# Patient Record
Sex: Female | Born: 1995 | Race: Black or African American | Hispanic: No | Marital: Single | Smoking: Never smoker
Health system: Southern US, Community
[De-identification: ages and names within clinical notes are randomized; demographics above are authoritative.]

---
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
## 2019-12-30 ENCOUNTER — Emergency Department (HOSPITAL_COMMUNITY)
Admission: EM | Admit: 2019-12-30 | Discharge: 2019-12-31 | Disposition: A | Discharge status: Home / Self Care | Payer: Medicaid Other | Attending: Emergency Medicine | Admitting: Emergency Medicine

## 2019-12-30 ENCOUNTER — Other Ambulatory Visit: Payer: Self-pay

## 2019-12-30 ENCOUNTER — Encounter (HOSPITAL_COMMUNITY): Payer: Self-pay | Admitting: Emergency Medicine

## 2019-12-30 DIAGNOSIS — X102XXA Contact with fats and cooking oils, initial encounter: Secondary | ICD-10-CM | POA: Insufficient documentation

## 2019-12-30 DIAGNOSIS — Y939 Activity, unspecified: Secondary | ICD-10-CM | POA: Diagnosis not present

## 2019-12-30 DIAGNOSIS — T300 Burn of unspecified body region, unspecified degree: Secondary | ICD-10-CM

## 2019-12-30 DIAGNOSIS — T23201A Burn of second degree of right hand, unspecified site, initial encounter: Secondary | ICD-10-CM | POA: Insufficient documentation

## 2019-12-30 DIAGNOSIS — Y999 Unspecified external cause status: Secondary | ICD-10-CM | POA: Diagnosis not present

## 2019-12-30 DIAGNOSIS — Y929 Unspecified place or not applicable: Secondary | ICD-10-CM | POA: Diagnosis not present

## 2019-12-30 DIAGNOSIS — T23261A Burn of second degree of back of right hand, initial encounter: Secondary | ICD-10-CM

## 2019-12-30 MED ORDER — BACITRACIN ZINC 500 UNIT/GM EX OINT
TOPICAL_OINTMENT | Freq: Two times a day (BID) | CUTANEOUS | Status: DC
  Filled 2019-12-30: qty 0.9

## 2019-12-30 MED ORDER — SILVER SULFADIAZINE 1 % EX CREA: 1.0000 "application " | TOPICAL_CREAM | Freq: Every day | CUTANEOUS | 0 refills | Status: DC

## 2019-12-30 MED ORDER — SILVER SULFADIAZINE 1 % EX CREA
TOPICAL_CREAM | Freq: Two times a day (BID) | CUTANEOUS | Status: DC
  Administered 2019-12-30: 1 via TOPICAL
  Filled 2019-12-30: qty 50

## 2019-12-30 MED ORDER — SILVER SULFADIAZINE 1 % EX CREA: 1.0000 "application " | TOPICAL_CREAM | Freq: Two times a day (BID) | CUTANEOUS | 0 refills | Status: DC
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
## 2019-12-30 NOTE — Discharge Instructions (Signed)
Keep the wound clean and dry. Wash the wounds with soap and water twice daily. Apply silvadene ointment to the wound and then put on the dry, sterile dressing.  Return to the ER if there is any fevers, chill, new swelling or redness.

## 2020-05-13 ENCOUNTER — Other Ambulatory Visit: Payer: Self-pay

## 2020-05-13 ENCOUNTER — Emergency Department (HOSPITAL_COMMUNITY)
Admission: EM | Admit: 2020-05-13 | Discharge: 2020-05-13 | Disposition: A | Discharge status: Home / Self Care | Payer: Medicaid Other | Attending: Emergency Medicine | Admitting: Emergency Medicine

## 2020-05-13 ENCOUNTER — Encounter (HOSPITAL_COMMUNITY): Payer: Self-pay

## 2020-05-13 ENCOUNTER — Emergency Department (HOSPITAL_COMMUNITY): Payer: Medicaid Other

## 2020-05-13 DIAGNOSIS — R059 Cough, unspecified: Secondary | ICD-10-CM | POA: Diagnosis present

## 2020-05-13 DIAGNOSIS — Z20822 Contact with and (suspected) exposure to covid-19: Secondary | ICD-10-CM | POA: Insufficient documentation

## 2020-05-13 DIAGNOSIS — J069 Acute upper respiratory infection, unspecified: Secondary | ICD-10-CM | POA: Diagnosis not present

## 2020-05-13 DIAGNOSIS — R0602 Shortness of breath: Secondary | ICD-10-CM | POA: Diagnosis not present

## 2020-05-13 LAB — SARS CORONAVIRUS 2 (TAT 6-24 HRS): SARS Coronavirus 2: NEGATIVE

## 2020-05-13 MED ORDER — ALBUTEROL SULFATE HFA 108 (90 BASE) MCG/ACT IN AERS: 2.0000 | INHALATION_SPRAY | Freq: Four times a day (QID) | RESPIRATORY_TRACT | 2 refills | Status: DC | PRN

## 2020-05-13 NOTE — ED Triage Notes (Signed)
Pt reports intermittent shob and mild, aggravating nonproductive cough since Tuesday.

## 2020-05-13 NOTE — ED Provider Notes (Signed)
[facility]-EMERGENCY DEPT Provider Note   CSN: [id number] Arrival date & time: 05/13/20  9735     History Chief Complaint  Patient presents with  . Cough    Kim Molina is a 25 y.o. female.  The history is provided by the patient. No language interpreter was used.  Cough Cough characteristics:  Productive Sputum characteristics:  Nondescript Severity:  Moderate Onset quality:  Gradual Duration:  1 week Timing:  Constant Relieved by:  Nothing Worsened by:  Nothing Ineffective treatments:  None tried Associated symptoms: shortness of breath   Pt reports she had covid in 2020.  Pt thinks she may have covid again    History reviewed. No pertinent past medical history.  There are no problems to display for this patient.   Past Surgical History:  Procedure Laterality Date  . APPENDECTOMY  2017     OB History   No obstetric history on file.     No family history on file.  Social History   Tobacco Use  . Smoking status: Never Smoker  . Smokeless tobacco: Never Used  Substance Use Topics  . Drug use: Yes    Types: Marijuana    Home Medications Prior to Admission medications   Medication Sig Start Date End Date Taking? Authorizing Provider  albuterol (VENTOLIN HFA) 108 (90 Base) MCG/ACT inhaler Inhale 2 puffs into the lungs every 6 (six) hours as needed for wheezing or shortness of breath. 05/13/20  Yes Cheron Schaumann K, PA-C  silver sulfADIAZINE (SILVADENE) 1 % cream Apply 1 application topically 2 (two) times daily. 12/30/19   Derwood Kaplan, MD    Allergies    Penicillins  Review of Systems   Review of Systems  Respiratory: Positive for cough and shortness of breath.   All other systems reviewed and are negative.   Physical Exam Updated Vital Signs BP (!) 142/95 (BP Location: Right Arm)   Pulse 75   Temp 98.6 F (37 C) (Oral)   Resp 18   Ht 5' (1.524 m)   Wt 93.9 kg   SpO2 93%   BMI 40.43 kg/m   Physical Exam Vitals  and nursing note reviewed.  Constitutional:      Appearance: She is well-developed and well-nourished.  HENT:     Head: Normocephalic.  Eyes:     Extraocular Movements: EOM normal.  Cardiovascular:     Rate and Rhythm: Normal rate.  Pulmonary:     Effort: Pulmonary effort is normal.     Breath sounds: No wheezing.  Abdominal:     General: There is no distension.  Musculoskeletal:        General: Normal range of motion.     Cervical back: Normal range of motion.  Neurological:     Mental Status: She is alert and oriented to person, place, and time.  Psychiatric:        Mood and Affect: Mood and affect normal.     ED Results / Procedures / Treatments   Labs (all labs ordered are listed, but only abnormal results are displayed) Labs Reviewed  SARS CORONAVIRUS 2 (TAT 6-24 HRS)    EKG None  Radiology DG Chest 2 View  Result Date: 05/13/2020 CLINICAL DATA:  Fever, cough and shortness of breath EXAM: CHEST - 2 VIEW COMPARISON:  None. FINDINGS: The heart size and mediastinal contours are within normal limits. There is no evidence of pulmonary edema, consolidation, pneumothorax, nodule or pleural fluid. The visualized skeletal structures are unremarkable. IMPRESSION: No  active cardiopulmonary disease. Electronically Signed   By: Irish Lack M.D.   On: 05/13/2020 12:16    Procedures Procedures (including critical care time)  Medications Ordered in ED Medications - No data to display  ED Course  I have reviewed the triage vital signs and the nursing notes.  Pertinent labs & imaging results that were available during my care of the patient were reviewed by me and considered in my medical decision making (see chart for details).    MDM Rules/Calculators/A&P                          Covid pending. Chest xray no acute abnormaliaty  Final Clinical Impression(s) / ED Diagnoses Final diagnoses:  Viral URI with cough    Rx / DC Orders ED Discharge Orders          Ordered    albuterol (VENTOLIN HFA) 108 (90 Base) MCG/ACT inhaler  Every 6 hours PRN        05/13/20 1435        An After Visit Summary was printed and given to the patient.    Osie Cheeks 05/13/20 1522    Lorre Nick, MD 05/15/20 1320

## 2020-05-13 NOTE — Discharge Instructions (Addendum)
Your covid test is pending 

## 2020-06-10 MED ORDER — IOHEXOL 9 MG/ML PO SOLN
ORAL | Status: AC
  Filled 2020-06-10: qty 500

## 2020-09-28 ENCOUNTER — Encounter (HOSPITAL_COMMUNITY): Payer: Self-pay | Admitting: Emergency Medicine

## 2020-09-28 ENCOUNTER — Emergency Department (HOSPITAL_COMMUNITY): Payer: Medicaid Other

## 2020-09-28 ENCOUNTER — Other Ambulatory Visit: Payer: Self-pay

## 2020-09-28 ENCOUNTER — Emergency Department (HOSPITAL_COMMUNITY)
Admission: EM | Admit: 2020-09-28 | Discharge: 2020-09-28 | Disposition: A | Discharge status: Home / Self Care | Payer: Medicaid Other | Attending: Emergency Medicine | Admitting: Emergency Medicine

## 2020-09-28 DIAGNOSIS — M25571 Pain in right ankle and joints of right foot: Secondary | ICD-10-CM | POA: Diagnosis present

## 2020-09-28 MED ORDER — ACETAMINOPHEN 500 MG PO TABS
1000.0000 mg | ORAL_TABLET | Freq: Once | ORAL | Status: AC
  Administered 2020-09-28: 1000 mg via ORAL
  Filled 2020-09-28: qty 2

## 2020-09-28 MED ORDER — IBUPROFEN 800 MG PO TABS
800.0000 mg | ORAL_TABLET | Freq: Once | ORAL | Status: AC
  Administered 2020-09-28: 800 mg via ORAL
  Filled 2020-09-28: qty 1

## 2020-09-28 NOTE — ED Provider Notes (Signed)
[facility]   CSN: [id number] Arrival date & time: 09/28/20  5465     History Chief Complaint  Patient presents with  . Leg Pain    Kim Molina is a 25 y.o. female who presents with severe right ankle pain that woke her from sleep at 3:00 this morning.  Patient states she has had soreness in the ankle for 2 days, however the pain woke her from her sleep and the swelling that she is experiencing this morning is new.  She states she is unable to flex the joint, however it is unclear if it is secondary to pain or because she physically is unable to range it.  Denies any swelling in her leg but does endorse that the pain radiates up into her calf.  Pain is elicited when she bears weight on the foot.  Denies any history of trauma or injury to the foot.  I personally reviewed this patient's medical records.  She is an otherwise healthy 25 year old female with no new medications every day. Denies any history of gout or IV drug use.   HPI     History reviewed. No pertinent past medical history.  There are no problems to display for this patient.   Past Surgical History:  Procedure Laterality Date  . APPENDECTOMY  2017     OB History   No obstetric history on file.     No family history on file.  Social History   Tobacco Use  . Smoking status: Never Smoker  . Smokeless tobacco: Never Used  Substance Use Topics  . Drug use: Yes    Types: Marijuana    Home Medications Prior to Admission medications   Medication Sig Start Date End Date Taking? Authorizing Provider  albuterol (VENTOLIN HFA) 108 (90 Base) MCG/ACT inhaler Inhale 2 puffs into the lungs every 6 (six) hours as needed for wheezing or shortness of breath. Patient not taking: No sig reported 05/13/20   Elson Areas, PA-C  silver sulfADIAZINE (SILVADENE) 1 % cream Apply 1 application topically 2 (two) times daily. Patient not taking: No sig reported 12/30/19    Derwood Kaplan, MD    Allergies    Penicillins  Review of Systems   Review of Systems  Constitutional: Negative.   HENT: Negative.   Respiratory: Negative.   Cardiovascular: Negative.   Gastrointestinal: Negative.   Musculoskeletal: Positive for arthralgias and joint swelling.  Skin: Negative.   Allergic/Immunologic: Negative.   Hematological: Negative.     Physical Exam Updated Vital Signs BP (!) 128/91   Pulse 76   Temp 98.2 F (36.8 C) (Oral)   Resp 18   LMP 09/06/2020   SpO2 99%   Physical Exam Vitals and nursing note reviewed.  Constitutional:      Appearance: She is obese. She is not ill-appearing or toxic-appearing.  HENT:     Head: Normocephalic and atraumatic.     Nose: Nose normal.     Mouth/Throat:     Mouth: Mucous membranes are moist.     Pharynx: Oropharynx is clear. Uvula midline. No oropharyngeal exudate, posterior oropharyngeal erythema or uvula swelling.     Tonsils: No tonsillar exudate.  Eyes:     General: Lids are normal. Vision grossly intact.        Right eye: No discharge.        Left eye: No discharge.     Extraocular Movements: Extraocular movements intact.     Conjunctiva/sclera: Conjunctivae normal.  Pupils: Pupils are equal, round, and reactive to light.  Cardiovascular:     Rate and Rhythm: Normal rate and regular rhythm.     Pulses: Normal pulses.     Heart sounds: Normal heart sounds. No murmur heard.   Pulmonary:     Effort: Pulmonary effort is normal. No tachypnea, bradypnea or respiratory distress.     Breath sounds: Normal breath sounds. No wheezing or rales.  Chest:     Chest wall: No mass, lacerations, deformity, swelling, tenderness or edema.  Abdominal:     General: Bowel sounds are normal. There is no distension.     Palpations: Abdomen is soft.     Tenderness: There is no abdominal tenderness. There is no right CVA tenderness or left CVA tenderness.  Musculoskeletal:        General: No deformity.      Cervical back: Normal range of motion and neck supple. No edema, rigidity or crepitus. No pain with movement.     Right knee: Normal.     Left knee: Normal.     Right lower leg: Normal. No edema.     Left lower leg: Normal. No edema.     Right ankle: Swelling present. No deformity. Tenderness present over the ATF ligament and AITF ligament. No lateral malleolus tenderness. Decreased range of motion. Anterior drawer test negative. Normal pulse.     Right Achilles Tendon: Normal.     Left ankle: Normal.     Left Achilles Tendon: Normal.     Right foot: Normal capillary refill. Swelling present. No crepitus. Normal pulse.     Left foot: Normal.       Legs:  Skin:    General: Skin is warm and dry.     Capillary Refill: Capillary refill takes less than 2 seconds.  Neurological:     Mental Status: She is alert. Mental status is at baseline.     Sensory: Sensation is intact.  Psychiatric:        Mood and Affect: Mood normal.     ED Results / Procedures / Treatments   Labs (all labs ordered are listed, but only abnormal results are displayed) Labs Reviewed - No data to display  EKG None  Radiology DG Ankle Complete Right  Result Date: 09/28/2020 CLINICAL DATA:  Pain EXAM: RIGHT ANKLE - COMPLETE 3+ VIEW COMPARISON:  None. FINDINGS: Frontal, oblique, and lateral views were obtained. No acute fracture or joint effusion. Calcifications inferior to the lateral malleolus may represent residua of prior trauma. There is no appreciable joint space narrowing or erosion. Ankle mortise appears intact. IMPRESSION: Calcifications inferior to the lateral malleolus likely represent residua of prior trauma. No acute fracture evident. No arthropathy. Ankle mortise appears intact. Electronically Signed   By: Bretta Bang III M.D.   On: 09/28/2020 10:28   DG Foot Complete Right  Result Date: 09/28/2020 CLINICAL DATA:  Pain and swelling EXAM: RIGHT FOOT COMPLETE - 3+ VIEW COMPARISON:  None. FINDINGS:  Frontal, oblique, and lateral views were obtained. No fracture or dislocation. No appreciable joint space narrowing. There is a degree of hallux valgus deformity at the first MTP joint with mild bunion formation medially. There is an os naviculare, an anatomic variant. IMPRESSION: There is a degree of hallux valgus deformity at the first MTP joint with mild bunion formation medially. No appreciable joint space narrowing or erosion. No fracture or dislocation. Electronically Signed   By: Bretta Bang III M.D.   On: 09/28/2020 10:30  Procedures Procedures  Medications Ordered in ED Medications  ibuprofen (ADVIL) tablet 800 mg (800 mg Oral Given 09/28/20 1014)  acetaminophen (TYLENOL) tablet 1,000 mg (1,000 mg Oral Given 09/28/20 1227)    ED Course  I have reviewed the triage vital signs and the nursing notes.  Pertinent labs & imaging results that were available during my care of the patient were reviewed by me and considered in my medical decision making (see chart for details).    MDM Rules/Calculators/A&P                         25 year old female who presents for severe ankle pain that woke her from her sleep this morning.  No history of trauma to the joint, no history of gout.    Differential diagnosis includes but is not limited to acute fracture dislocation, ankle sprain/ ligamentous injury, gout, pseudogout, septic arthritis, osteoarthritis, cellulitis.  Vital signs are normal on intake.  Cardiopulmonary exam is normal abdominal exam is benign.  Patient has normal 2+ DP pulses bilaterally, full sensation and normal capillary refill in all 10 toes.  Patient with symmetric strength in plantar flexion.  Patient unwilling to attempt dorsiflexion in the right ankle given pain in the joint.  There is no erythema, warmth to touch, or significant edema of the right anterior ankle to suggest gout or pseudogout, or infectious etiology.  No acute fracture or dislocation on plain films, though  there is changes suggestive of remote trauma to the right ankle.  While the exact etiology of this patient symptoms remains unclear, there is no evidence of infectious etiology, or neurovascular compromise.  Will place patient in Ace wrap and provide crutches and discharged with plan for scheduled NSAID therapy and follow-up with orthopedics.  No further work-up warranted in the ED at this time.  Darrin voiced understanding of her medical evaluation and treatment plan.  Each of her questions was answered to her expressed satisfaction.  Return precautions were given.  Patient is stable and appropriate for discharge at this time.  This chart was dictated using voice recognition software, Dragon. Despite the best efforts of this provider to proofread and correct errors, errors may still occur which can change documentation meaning.  Final Clinical Impression(s) / ED Diagnoses Final diagnoses:  Acute right ankle pain    Rx / DC Orders ED Discharge Orders    None       Sherrilee Gilles 09/28/20 1721    Gerhard Munch, MD 09/29/20 2339

## 2020-09-28 NOTE — Discharge Instructions (Addendum)
You were seen in the ER today for your right ankle pain.  Your physical exam and x-rays are reassuring.  You have normal blood flow and sensation in your feet, and you are able to move the toes normally.  There is no broken bones or dislocations on your x-ray.  With the exact cause of your pain remains unclear, there does not appear to be any emergent issue with your ankle at this time.  To help with your pain you been placed on Ace wrap and given crutches.  You may take ibuprofen and Tylenol around-the-clock to help with your symptoms.  You may alternate ibuprofen 600 mg and Tylenol 1000 mg every 3 hours.  Below is contact information for Dr. August Saucer, the orthopedic doctor with whom you should follow-up.  Please call his office first thing tomorrow morning to schedule follow-up appointment.  You have been placed in a splint and crutches for your comfort.  Return to the ER if you develop any new numbness, tingling, weakness in the foot, or any other new severe symptoms.

## 2020-09-28 NOTE — ED Triage Notes (Signed)
Patient c/o right leg pain starting at ankle and progressing up leg x2 days. Denies trauma or injury. Strong pulse present.

## 2020-09-28 NOTE — Progress Notes (Signed)
Orthopedic Tech Progress Note Patient Details:  Kim Molina 1995-08-25 [id number]  Ortho Devices Type of Ortho Device: Ace wrap,Crutches Ortho Device/Splint Location: right Ortho Device/Splint Interventions: Application   Post Interventions Patient Tolerated: Well Instructions Provided: Care of device   Saul Fordyce 09/28/2020, 12:19 PM

## 2020-09-28 NOTE — ED Notes (Signed)
Patient transported to X-ray 

## 2020-10-03 ENCOUNTER — Ambulatory Visit: Payer: Medicaid Other | Admitting: Surgical

## 2020-10-17 ENCOUNTER — Ambulatory Visit: Payer: Medicaid Other | Admitting: Surgical

## 2021-03-23 ENCOUNTER — Emergency Department (HOSPITAL_COMMUNITY)
Admission: EM | Admit: 2021-03-23 | Discharge: 2021-03-23 | Disposition: A | Discharge status: Home / Self Care | Payer: Medicaid Other | Attending: Emergency Medicine | Admitting: Emergency Medicine

## 2021-03-23 ENCOUNTER — Encounter (HOSPITAL_COMMUNITY): Payer: Self-pay

## 2021-03-23 ENCOUNTER — Other Ambulatory Visit: Payer: Self-pay

## 2021-03-23 DIAGNOSIS — B029 Zoster without complications: Secondary | ICD-10-CM | POA: Insufficient documentation

## 2021-03-23 DIAGNOSIS — R1032 Left lower quadrant pain: Secondary | ICD-10-CM | POA: Insufficient documentation

## 2021-03-23 MED ORDER — GABAPENTIN 100 MG PO CAPS: 100.0000 mg | ORAL_CAPSULE | Freq: Three times a day (TID) | ORAL | 0 refills | Status: AC

## 2021-03-23 MED ORDER — ACYCLOVIR 400 MG PO TABS: 400.0000 mg | ORAL_TABLET | Freq: Four times a day (QID) | ORAL | 0 refills | Status: AC

## 2021-03-23 NOTE — ED Triage Notes (Signed)
Patient c/o LLQ abdominal pain x 2 days.  Patient also reports a rash on her left buttock and few on her left upper thigh.

## 2021-03-23 NOTE — ED Provider Notes (Signed)
[facility]-EMERGENCY DEPT Provider Note   CSN: [id number] Arrival date & time: 03/23/21  0744     History Chief Complaint  Patient presents with   Abdominal Pain   Rash    Kim Molina is a 25 y.o. female.  The history is provided by the patient. No language interpreter was used.  Abdominal Pain Associated symptoms: no fever   Rash Associated symptoms: abdominal pain   Associated symptoms: no fever    25 year old female prior surgical history including appendectomy who presents for evaluation of abdominal pain.  Patient noticing a rash that appears to her left buttock and wraps around left groin region for the past 2 days.  Describes rash as a slightly itchy but more sharp and burning sensation moderate in severity that has been persistent.  Initially in triage note she mention having left lower quadrant abdominal pain however patient clarified that the pain is near the site of the rash.  She denies fever headache chest pain trouble breathing nausea vomiting diarrhea dysuria or postprandial pain.  She denies any contact to any environmental changes.  No specific treatment tried.  History reviewed. No pertinent past medical history.  There are no problems to display for this patient.   Past Surgical History:  Procedure Laterality Date   APPENDECTOMY  2017     OB History   No obstetric history on file.     Family History  Problem Relation Age of Onset   Cancer Mother    Healthy Father     Social History   Tobacco Use   Smoking status: Never   Smokeless tobacco: Never  Vaping Use   Vaping Use: Never used  Substance Use Topics   Alcohol use: Never   Drug use: Yes    Types: Marijuana    Home Medications Prior to Admission medications   Medication Sig Start Date End Date Taking? Authorizing Provider  albuterol (VENTOLIN HFA) 108 (90 Base) MCG/ACT inhaler Inhale 2 puffs into the lungs every 6 (six) hours as needed for wheezing or  shortness of breath. Patient not taking: No sig reported 05/13/20   Elson Areas, PA-C  silver sulfADIAZINE (SILVADENE) 1 % cream Apply 1 application topically 2 (two) times daily. Patient not taking: No sig reported 12/30/19   Derwood Kaplan, MD    Allergies    Penicillins  Review of Systems   Review of Systems  Constitutional:  Negative for fever.  Gastrointestinal:  Positive for abdominal pain.  Skin:  Positive for rash.   Physical Exam Updated Vital Signs BP 126/86 (BP Location: Right Arm)   Pulse (!) 101   Temp 98.2 F (36.8 C) (Oral)   Resp 18   Ht 5' (1.524 m)   Wt 88 kg   LMP 02/22/2021 (Approximate)   SpO2 98%   BMI 37.89 kg/m   Physical Exam Vitals and nursing note reviewed.  Constitutional:      General: She is not in acute distress.    Appearance: She is well-developed.  HENT:     Head: Atraumatic.  Eyes:     Conjunctiva/sclera: Conjunctivae normal.  Pulmonary:     Effort: Pulmonary effort is normal.  Abdominal:     General: Abdomen is flat.     Palpations: Abdomen is soft.     Tenderness: There is no abdominal tenderness.  Musculoskeletal:     Cervical back: Neck supple.  Skin:    Findings: Rash (Multiple vesicular rash noted to the left buttock and left  inguinal region following the L2 dermatomal pattern.) present.  Neurological:     Mental Status: She is alert.  Psychiatric:        Mood and Affect: Mood normal.    ED Results / Procedures / Treatments   Labs (all labs ordered are listed, but only abnormal results are displayed) Labs Reviewed - No data to display  EKG None  Radiology No results found.  Procedures Procedures   Medications Ordered in ED Medications - No data to display  ED Course  I have reviewed the triage vital signs and the nursing notes.  Pertinent labs & imaging results that were available during my care of the patient were reviewed by me and considered in my medical decision making (see chart for  details).    MDM Rules/Calculators/A&P                           BP 124/84 (BP Location: Left Arm)   Pulse 84   Temp 98.2 F (36.8 C) (Oral)   Resp 18   Ht 5' (1.524 m)   Wt 88 kg   LMP 02/22/2021 (Approximate)   SpO2 100%   BMI 37.89 kg/m   Final Clinical Impression(s) / ED Diagnoses Final diagnoses:  Herpes zoster without complication    Rx / DC Orders ED Discharge Orders          Ordered    acyclovir (ZOVIRAX) 400 MG tablet  4 times daily        03/23/21 0835    gabapentin (NEURONTIN) 100 MG capsule  3 times daily        03/23/21 0835           8:33 AM Patient here with vesicular rash to her left inguinal region following the L2 dermatome consistent with shingles.  She does not have abdominal pain.  Her pain is related to her shingles rash.  She will benefit from antiviral medication as well as pain management.  Return precaution given   Fayrene Helper, PA-C 03/23/21 4562    Wynetta Fines, MD 03/24/21 [phone]

## 2021-03-23 NOTE — Discharge Instructions (Signed)
Your rash is related to shingles.  Please take antiviral medication and medication to help with nerve pain.  Take medication for the full duration.

## 2021-04-30 ENCOUNTER — Emergency Department (HOSPITAL_COMMUNITY)
Admission: EM | Admit: 2021-04-30 | Discharge: 2021-04-30 | Discharge status: Against Medical Advice | Payer: Medicaid Other | Source: Home / Self Care

## 2021-05-02 ENCOUNTER — Emergency Department (HOSPITAL_COMMUNITY)
Admission: EM | Admit: 2021-05-02 | Discharge: 2021-05-02 | Disposition: A | Discharge status: Home / Self Care | Payer: Medicaid Other | Attending: Emergency Medicine | Admitting: Emergency Medicine

## 2021-05-02 ENCOUNTER — Encounter (HOSPITAL_COMMUNITY): Payer: Self-pay | Admitting: *Deleted

## 2021-05-02 ENCOUNTER — Other Ambulatory Visit: Payer: Self-pay

## 2021-05-02 DIAGNOSIS — Z79899 Other long term (current) drug therapy: Secondary | ICD-10-CM | POA: Insufficient documentation

## 2021-05-02 DIAGNOSIS — K0889 Other specified disorders of teeth and supporting structures: Secondary | ICD-10-CM | POA: Diagnosis present

## 2021-05-02 DIAGNOSIS — K047 Periapical abscess without sinus: Secondary | ICD-10-CM | POA: Diagnosis not present

## 2021-05-02 MED ORDER — CHLORHEXIDINE GLUCONATE 0.12 % MT SOLN: 15.0000 mL | Freq: Two times a day (BID) | OROMUCOSAL | 0 refills | Status: AC

## 2021-05-02 MED ORDER — CLINDAMYCIN HCL 150 MG PO CAPS: 450.0000 mg | ORAL_CAPSULE | Freq: Three times a day (TID) | ORAL | 0 refills | Status: AC

## 2021-05-02 NOTE — ED Provider Notes (Signed)
[facility] EMERGENCY DEPARTMENT Provider Note   CSN: [id number] Arrival date & time: 05/02/21  0630     History   Chief Complaint Chief Complaint  Patient presents with   Dental Pain    HPI Kim Molina is a 26 y.o. female.  Patient presents to the emergency department with a dental complaint. Symptoms began 3 days ago. The patient has tried to alleviate pain with nothing.  Pain rated as severe, characterized as throbbing in nature and located R lower dentition. Patient denies fever, night sweats, chills, difficulty swallowing or opening mouth, SOB, nuchal rigidity or decreased ROM of neck.  Patient does not have a dentist and requests a resource guide at discharge.       Dental Pain  History reviewed. No pertinent past medical history.  There are no problems to display for this patient.   Past Surgical History:  Procedure Laterality Date   APPENDECTOMY  2017     OB History   No obstetric history on file.      Home Medications    Prior to Admission medications   Medication Sig Start Date End Date Taking? Authorizing Provider  chlorhexidine (PERIDEX) 0.12 % solution Use as directed 15 mLs in the mouth or throat 2 (two) times daily. 05/02/21  Yes Halei Hanover S, PA  clindamycin (CLEOCIN) 150 MG capsule Take 3 capsules (450 mg total) by mouth 3 (three) times daily for 7 days. 05/02/21 05/09/21 Yes Dhanush Jokerst, Rodrigo Ran, PA  acyclovir (ZOVIRAX) 400 MG tablet Take 1 tablet (400 mg total) by mouth 4 (four) times daily. 03/23/21   Fayrene Helper, PA-C  gabapentin (NEURONTIN) 100 MG capsule Take 1 capsule (100 mg total) by mouth 3 (three) times daily. 03/23/21   Fayrene Helper, PA-C    Family History Family History  Problem Relation Age of Onset   Cancer Mother    Healthy Father     Social History Social History   Tobacco Use   Smoking status: Never   Smokeless tobacco: Never  Vaping Use   Vaping Use: Never used  Substance Use Topics   Alcohol use:  Never   Drug use: Yes    Types: Marijuana     Allergies   Penicillins   Review of Systems Denies fevers, chills, difficulty swallowing or eating, changes in voice, pain under tongue, nausea, vomiting, lightheadedness or dizziness. No trismus   Physical Exam Updated Vital Signs BP 113/61 (BP Location: Right Arm)    Pulse 76    Temp 99 F (37.2 C) (Oral)    Resp 18    Ht 5' (1.524 m)    Wt 88 kg    LMP 03/30/2021    SpO2 99%    BMI 37.89 kg/m   Physical Exam Physical Exam  Constitutional: Pt appears well-developed and well-nourished.  HENT:  Head: Normocephalic.  Right Ear: Tympanic membrane, external ear and ear canal normal.  Left Ear: Tympanic membrane, external ear and ear canal normal.  Nose: Nose normal. Right sinus exhibits no maxillary sinus tenderness and no frontal sinus tenderness. Left sinus exhibits no maxillary sinus tenderness and no frontal sinus tenderness.  Mouth/Throat: Uvula is midline, oropharynx is clear and moist and mucous membranes are normal. No oral lesions. No uvula swelling or lacerations. No oropharyngeal exudate, posterior oropharyngeal edema, posterior oropharyngeal erythema or tonsillar abscesses.  Poor dentition No gingival swelling, fluctuance or induration No gross abscess  No sublingual edema, tenderness to palpation, or sign of Ludwig's angina, or deep space  infection Pain at R lower molar. Small open periapical abscess present.  Eyes: Conjunctivae are normal. Pupils are equal, round, and reactive to light. Right eye exhibits no discharge. Left eye exhibits no discharge.  Neck: Normal range of motion. Neck supple.  No stridor Handling secretions without difficulty No nuchal rigidity No cervical lymphadenopathy Cardiovascular: Normal rate, regular rhythm and normal heart sounds.   Pulmonary/Chest: Effort normal. No respiratory distress.  Equal chest rise  Abdominal: Soft. Bowel sounds are normal. Pt exhibits no distension. There is no  tenderness.  Lymphadenopathy: Pt has no cervical adenopathy.  Neurological: Pt is alert and oriented x 4  Skin: Skin is warm and dry.  Psychiatric: Pt has a normal mood and affect.  Nursing note and vitals reviewed.   ED Treatments / Results  Labs (all labs ordered are listed, but only abnormal results are displayed) Labs Reviewed - No data to display  EKG    Radiology No results found.  Procedures Procedures (including critical care time)  Medications Ordered in ED Medications - No data to display   Initial Impression / Assessment and Plan / ED Course  I have reviewed the triage vital signs and the nursing notes.  Pertinent labs & imaging results that were available during my care of the patient were reviewed by me and considered in my medical decision making (see chart for details).        Patient with dentalgia.  No abscess requiring immediate incision and drainage.  Exam not concerning for Ludwig's angina or pharyngeal abscess.  Will treat with clindamycin since she has anaphylactic reaction to penicillins. Pt instructed to follow-up with dentist.  Discussed return precautions. Pt safe for discharge.  Given information for on-call dentistry  Final Clinical Impressions(s) / ED Diagnoses   Final diagnoses:  Periapical abscess    ED Discharge Orders          Ordered    clindamycin (CLEOCIN) 150 MG capsule  3 times daily        05/02/21 0740    chlorhexidine (PERIDEX) 0.12 % solution  2 times daily        05/02/21 0740              Gailen Shelter, [place] 05/02/21 1630    Terald Sleeper, MD 05/03/21 [phone]

## 2021-05-02 NOTE — ED Triage Notes (Signed)
The t is c/o a toothache and she thinks she has an abscess for 3 days she has had pain  lmp due

## 2021-05-02 NOTE — Discharge Instructions (Addendum)
Please take emetics as prescribed.  Please follow-up with the dentist I have given you the information for. Please use antibiotics for the entire course even if your symptoms improve.  Please use the pathologic 2 times daily Please use Tylenol or ibuprofen for pain.  You may use 600 mg ibuprofen every 6 hours or 1000 mg of Tylenol every 6 hours.  You may choose to alternate between the 2.  This would be most effective.  Not to exceed 4 g of Tylenol within 24 hours.  Not to exceed 3200 mg ibuprofen 24 hours.

## 2021-05-18 IMAGING — CR DG CHEST 2V
2 series · 2 of 2 positions shown · non-contrast
Comparison: None.

CLINICAL DATA: Fever, cough and shortness of breath

EXAM:
CHEST - 2 VIEW

[w chest pa]
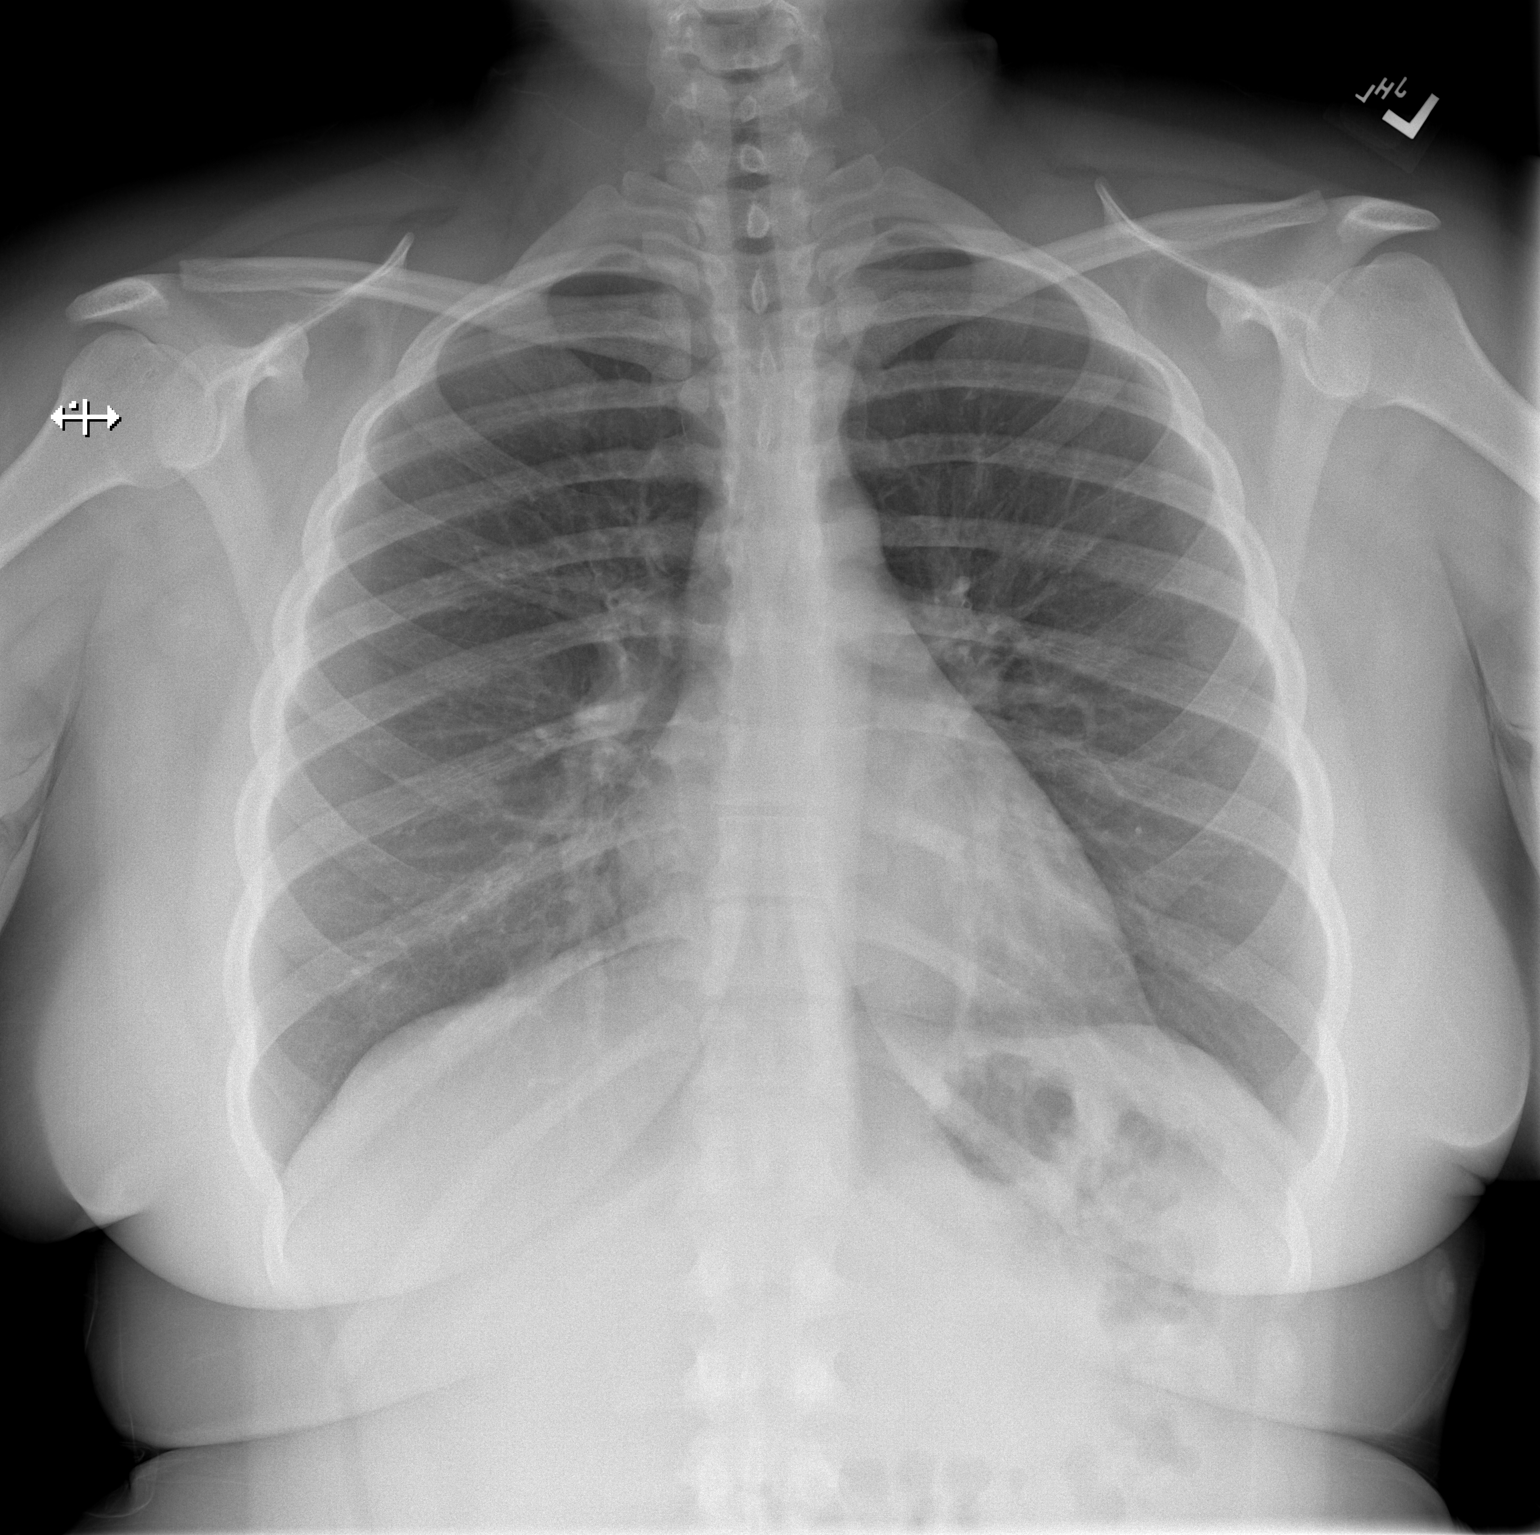

[w chest lat]
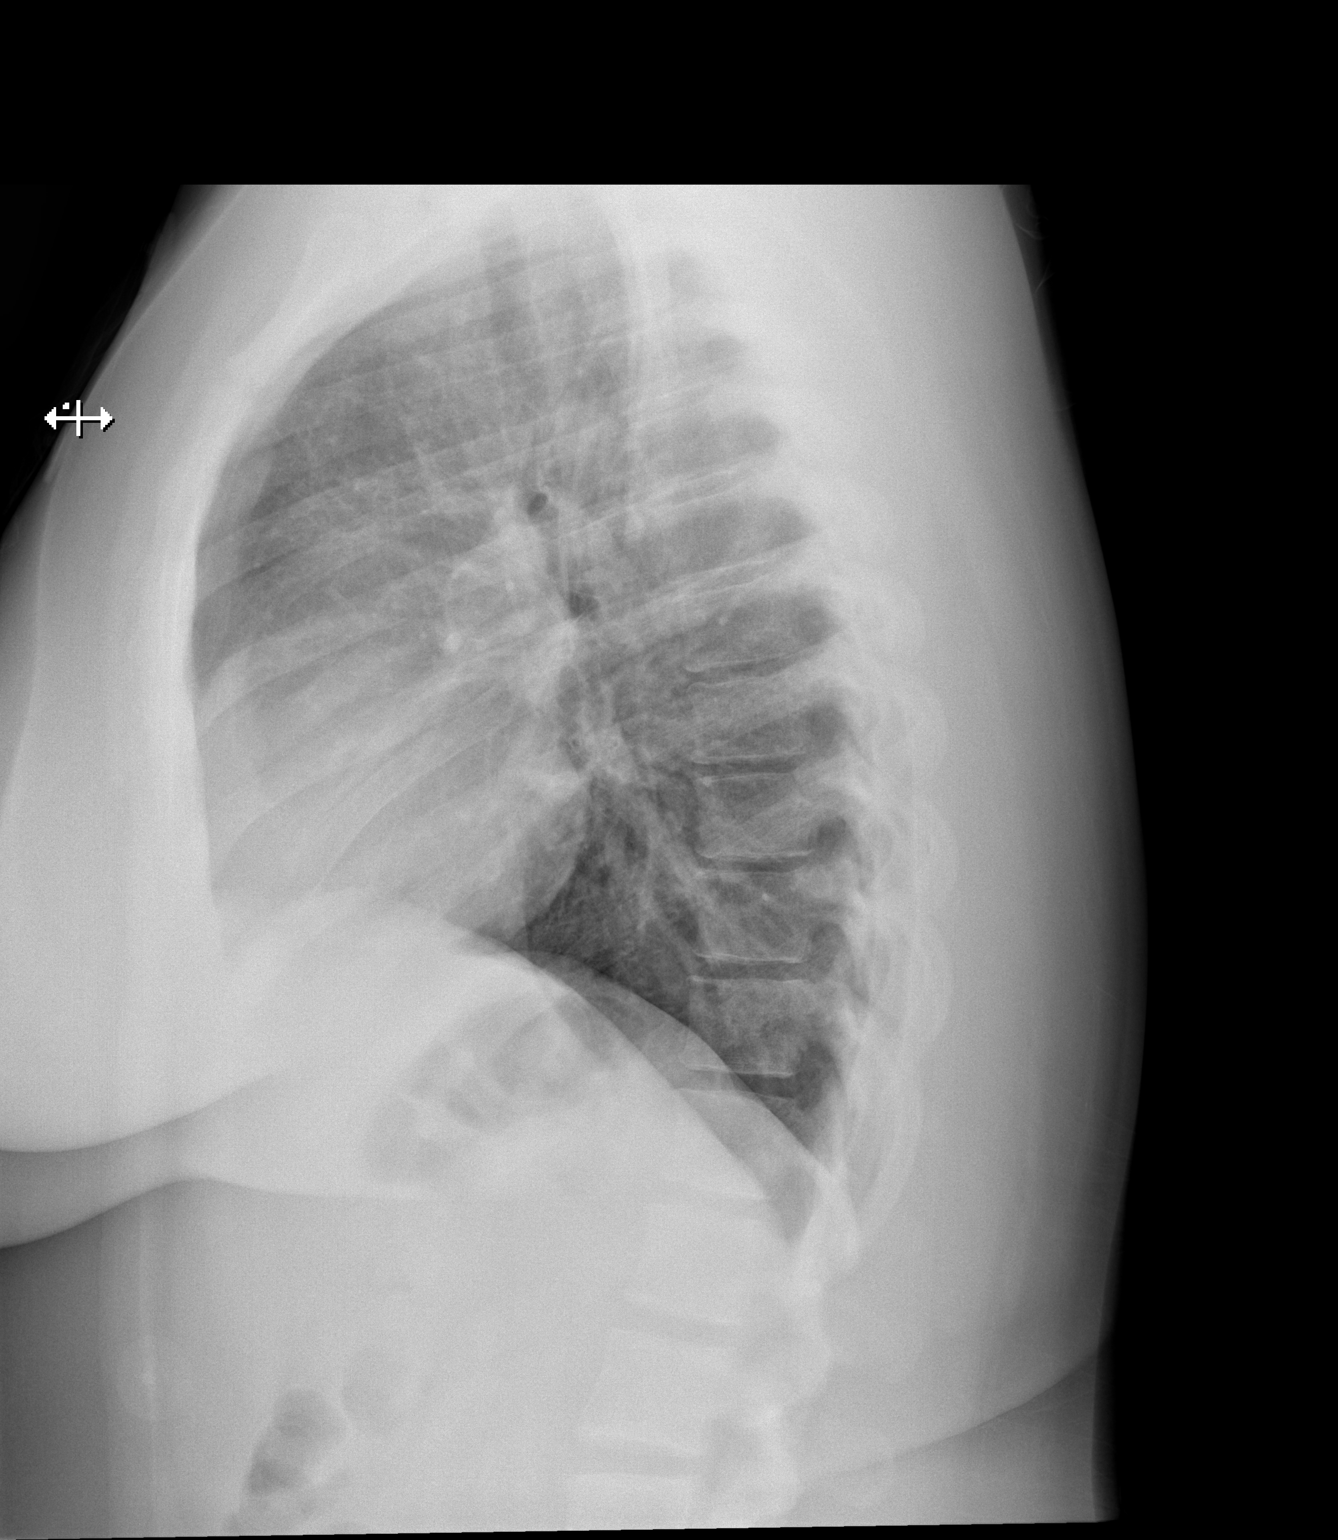

[2 of 2 positions shown; findings below may reference images not displayed]

FINDINGS: The heart size and mediastinal contours are within normal limits.
There is no evidence of pulmonary edema, consolidation,
pneumothorax, nodule or pleural fluid. The visualized skeletal
structures are unremarkable.
IMPRESSION: No active cardiopulmonary disease.

## 2021-06-03 ENCOUNTER — Emergency Department (HOSPITAL_COMMUNITY)
Admission: EM | Admit: 2021-06-03 | Discharge: 2021-06-03 | Disposition: A | Discharge status: Home / Self Care | Payer: Medicaid Other | Attending: Emergency Medicine | Admitting: Emergency Medicine

## 2021-06-03 ENCOUNTER — Other Ambulatory Visit: Payer: Self-pay

## 2021-06-03 ENCOUNTER — Encounter (HOSPITAL_COMMUNITY): Payer: Self-pay | Admitting: Emergency Medicine

## 2021-06-03 DIAGNOSIS — R197 Diarrhea, unspecified: Secondary | ICD-10-CM | POA: Diagnosis not present

## 2021-06-03 DIAGNOSIS — R112 Nausea with vomiting, unspecified: Secondary | ICD-10-CM | POA: Insufficient documentation

## 2021-06-03 DIAGNOSIS — Z5321 Procedure and treatment not carried out due to patient leaving prior to being seen by health care provider: Secondary | ICD-10-CM | POA: Insufficient documentation

## 2021-06-03 LAB — CBC
HCT: 37.5 % (ref 36.0–46.0)
Hemoglobin: 12.8 g/dL (ref 12.0–15.0)
MCH: 26.7 pg (ref 26.0–34.0)
MCHC: 34.1 g/dL (ref 30.0–36.0)
MCV: 78.1 fL — ABNORMAL LOW (ref 80.0–100.0)
Platelets: 430 10*3/uL — ABNORMAL HIGH (ref 150–400)
RBC: 4.8 MIL/uL (ref 3.87–5.11)
RDW: 15.5 % (ref 11.5–15.5)
WBC: 15 10*3/uL — ABNORMAL HIGH (ref 4.0–10.5)
nRBC: 0 % (ref 0.0–0.2)

## 2021-06-03 LAB — COMPREHENSIVE METABOLIC PANEL
ALT: 19 U/L (ref 0–44)
AST: 18 U/L (ref 15–41)
Albumin: 4.3 g/dL (ref 3.5–5.0)
Alkaline Phosphatase: 86 U/L (ref 38–126)
Anion gap: 13 (ref 5–15)
BUN: 7 mg/dL (ref 6–20)
CO2: 20 mmol/L — ABNORMAL LOW (ref 22–32)
Calcium: 9.2 mg/dL (ref 8.9–10.3)
Chloride: 109 mmol/L (ref 98–111)
Creatinine, Ser: 0.63 mg/dL (ref 0.44–1.00)
GFR, Estimated: >60 mL/min (ref >60)
Glucose, Bld: 151 mg/dL — ABNORMAL HIGH (ref 70–99)
Potassium: 3.5 mmol/L (ref 3.5–5.1)
Sodium: 142 mmol/L (ref 135–145)
Total Bilirubin: 0.2 mg/dL — ABNORMAL LOW (ref 0.3–1.2)
Total Protein: 7.8 g/dL (ref 6.5–8.1)

## 2021-06-03 LAB — LIPASE, BLOOD: Lipase: 31 U/L (ref 11–51)

## 2021-06-03 LAB — I-STAT BETA HCG BLOOD, ED (MC, WL, AP ONLY): I-stat hCG, quantitative: <5 m[IU]/mL (ref <5)

## 2021-06-03 MED ORDER — ONDANSETRON 4 MG PO TBDP
4.0000 mg | ORAL_TABLET | Freq: Once | ORAL | Status: AC
  Administered 2021-06-03: 4 mg via ORAL
  Filled 2021-06-03: qty 1

## 2021-06-03 NOTE — ED Triage Notes (Signed)
Pt reports emesis and diarrhea since this morning, had a bottle of wine last night and does not normally drink.

## 2021-06-03 NOTE — ED Provider Triage Note (Signed)
Emergency Medicine Provider Triage Evaluation Note  Emmalei Ertz , a 26 y.o. female  was evaluated in triage.  Pt complains of NVD starting this AM. Vomited >10 times today.  No fevers, no recent travel. Any recent antibiotics.   Drank heavily last night.   Review of Systems  Positive: NVD Negative: Fever  Physical Exam  BP 122/64 (BP Location: Right Arm)    Pulse 91    Temp 98 F (36.7 C) (Oral)    Resp 20    Ht 5' (1.524 m)    Wt 88 kg    LMP 05/05/2021 (Approximate)    SpO2 100%    BMI 37.89 kg/m  Gen:   Awake, no distress   Resp:  Normal effort  MSK:   Moves extremities without difficulty  Other:  Abd   Medical Decision Making  Medically screening exam initiated at 12:42 PM.  Appropriate orders placed.  Shameika Germani was informed that the remainder of the evaluation will be completed by another provider, this initial triage assessment does not replace that evaluation, and the importance of remaining in the ED until their evaluation is complete.  NVD since this AM. Drank heavily last night.    Pati Gallo Al[place], [place] 06/03/21 1244

## 2021-06-03 NOTE — ED Notes (Signed)
Called x2, no response.  

## 2021-10-03 IMAGING — CR DG ANKLE COMPLETE 3+V*R*
3 series · 3 of 3 positions shown · non-contrast
Comparison: None.

CLINICAL DATA: Pain

EXAM:
RIGHT ANKLE - COMPLETE 3+ VIEW

[x ankle ap right]
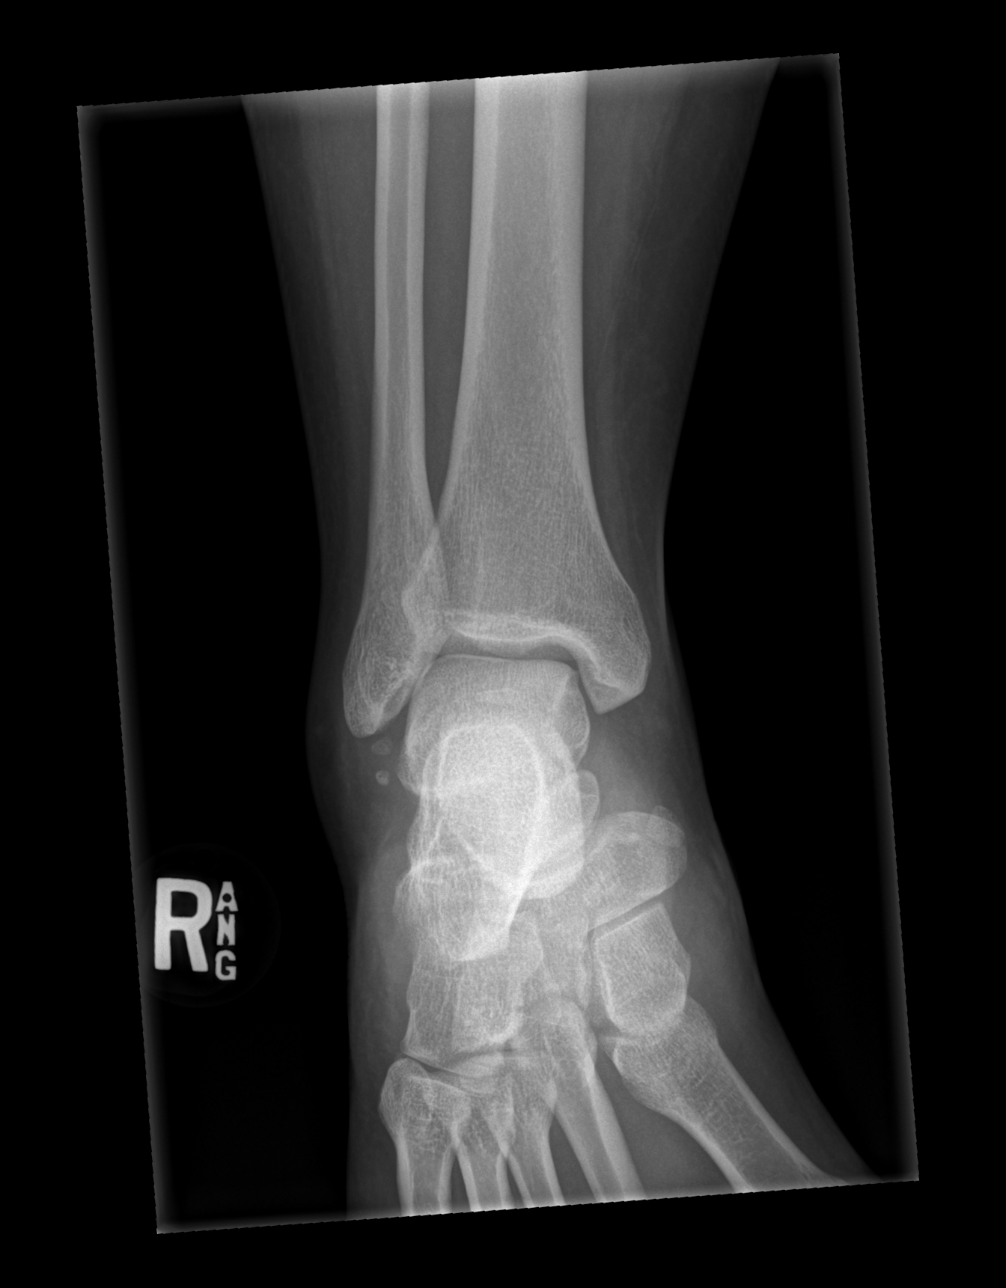

[x ankle obl right]
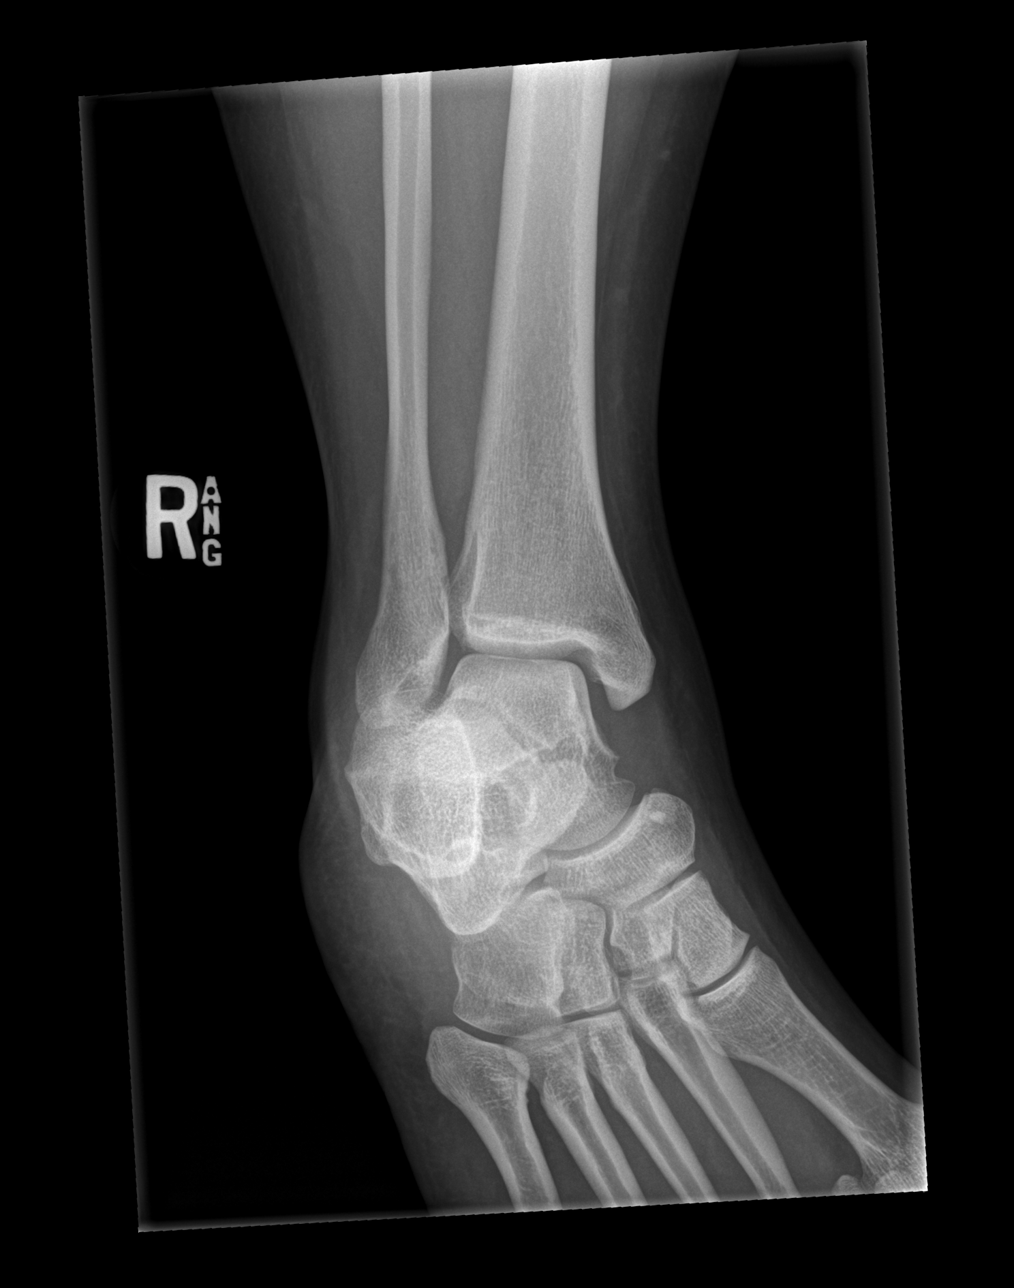

[x ankle lat right]
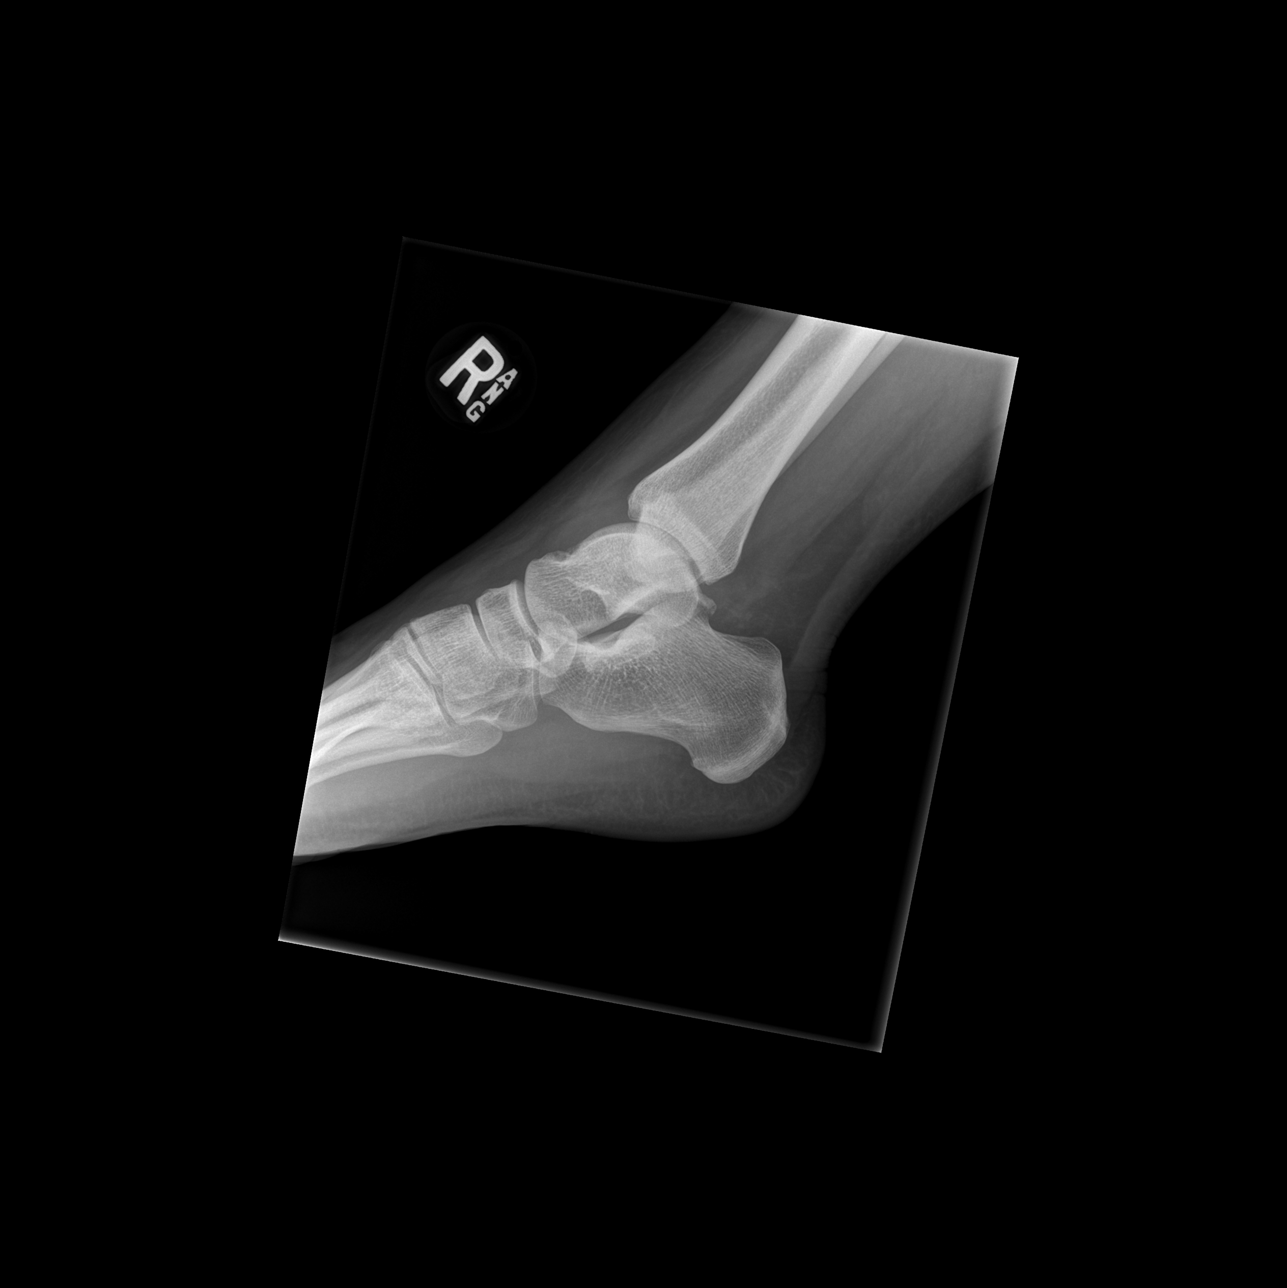

[3 of 3 positions shown; findings below may reference images not displayed]

FINDINGS: Frontal, oblique, and lateral views were obtained. No acute fracture
or joint effusion. Calcifications inferior to the lateral malleolus
may represent residua of prior trauma. There is no appreciable joint
space narrowing or erosion. Ankle mortise appears intact.
IMPRESSION: Calcifications inferior to the lateral malleolus likely represent
residua of prior trauma. No acute fracture evident. No arthropathy.
Ankle mortise appears intact.
